# Patient Record
Sex: Female | Born: 1970
Health system: Southern US, Community
[De-identification: ages and names within clinical notes are randomized; demographics above are authoritative.]

---

## 1997-08-01 ENCOUNTER — Other Ambulatory Visit: Admission: RE | Admit: 1997-08-01 | Discharge: 1997-08-01 | Payer: Self-pay | Admitting: *Deleted

## 1998-09-14 ENCOUNTER — Other Ambulatory Visit: Admission: RE | Admit: 1998-09-14 | Discharge: 1998-09-14 | Payer: Self-pay | Admitting: *Deleted

## 2001-07-08 ENCOUNTER — Other Ambulatory Visit: Admission: RE | Admit: 2001-07-08 | Discharge: 2001-07-08 | Payer: Self-pay | Admitting: Obstetrics and Gynecology

## 2002-07-13 ENCOUNTER — Other Ambulatory Visit: Admission: RE | Admit: 2002-07-13 | Discharge: 2002-07-13 | Payer: Self-pay | Admitting: Obstetrics and Gynecology

## 2003-07-23 ENCOUNTER — Inpatient Hospital Stay (HOSPITAL_COMMUNITY): Admission: AD | Admit: 2003-07-23 | Discharge: 2003-07-25 | Payer: Self-pay | Admitting: Obstetrics and Gynecology

## 2003-09-02 ENCOUNTER — Other Ambulatory Visit: Admission: RE | Admit: 2003-09-02 | Discharge: 2003-09-02 | Payer: Self-pay | Admitting: Obstetrics and Gynecology

## 2004-09-14 ENCOUNTER — Other Ambulatory Visit: Admission: RE | Admit: 2004-09-14 | Discharge: 2004-09-14 | Payer: Self-pay | Admitting: Obstetrics and Gynecology

## 2004-11-23 ENCOUNTER — Encounter: Admission: RE | Admit: 2004-11-23 | Discharge: 2004-11-23 | Payer: Self-pay | Admitting: Obstetrics and Gynecology

## 2005-12-16 ENCOUNTER — Inpatient Hospital Stay (HOSPITAL_COMMUNITY): Admission: AD | Admit: 2005-12-16 | Discharge: 2005-12-18 | Payer: Self-pay | Admitting: Obstetrics and Gynecology

## 2008-03-31 ENCOUNTER — Encounter: Admission: RE | Admit: 2008-03-31 | Discharge: 2008-03-31 | Payer: Self-pay | Admitting: General Surgery

## 2008-11-09 ENCOUNTER — Encounter: Admission: RE | Admit: 2008-11-09 | Discharge: 2008-11-09 | Payer: Self-pay | Admitting: Obstetrics and Gynecology

## 2009-04-19 ENCOUNTER — Encounter: Admission: RE | Admit: 2009-04-19 | Discharge: 2009-04-19 | Payer: Self-pay | Admitting: Internal Medicine

## 2009-11-15 ENCOUNTER — Encounter: Admission: RE | Admit: 2009-11-15 | Discharge: 2009-11-15 | Payer: Self-pay | Admitting: Obstetrics and Gynecology

## 2010-06-29 NOTE — Discharge Summary (Signed)
NAME:  Cheryl Hurst, Cheryl Hurst                   ACCOUNT NO.:  [id number]   MEDICAL RECORD NO.:  [id number]                   PATIENT TYPE:  INP   LOCATION:  9102                                 FACILITY:  [facility]   PHYSICIAN:  Zenaida Niece, M.D.             DATE OF BIRTH:  [birth year]   DATE OF ADMISSION:  07/23/2003  DATE OF DISCHARGE:  07/25/2003                                 DISCHARGE SUMMARY   ADMISSION DIAGNOSES:  1. Intrauterine pregnancy at 36 weeks.  2. Premature rupture of membranes.  3. History of herpes simplex virus.   DISCHARGE DIAGNOSES:  1. Intrauterine pregnancy at 36 weeks.  2. Premature rupture of membranes.  3. History of herpes simplex virus.   PROCEDURES:  On July 23, 2003 she had a spontaneous vaginal delivery.   HISTORY AND PHYSICAL:  This is a 40 year-old white female gravida 1, para 0  with an EGA of 36-plus weeks by a 9-week ultrasound with a due date of August 14, 2003 who presents with a complaint of spontaneous rupture of membranes at  2115 hours on July 22, 2003 with contractions starting at 2130 hours without  bleeding and with good fetal movement.  She did not come in to be evaluated  until after midnight on July 23, 2003.  On initial evaluation she was 8 cm  dilated.  Prenatal care complicated by positive Chlamydia treated with  Zithromax with a negative test of cure and she was recently started on  Valtrex for HSV suppression without lesion or symptoms.   PRENATAL LABORATORIES:  Blood type A positive with a negative antibody  screen, RPR nonreactive, rubella immune, hepatitis B surface antigen  negative, HIV negative, gonorrhea negative, Chlamydia was positive, Pap  smear was normal, 1-hour glucola 133, group B strep culture is pending, it  was performed on July 20, 2003.   GYNECOLOGIC HISTORY:  History of herpes simplex virus.   PAST MEDICAL HISTORY:  History of anemia and irritable bowel syndrome.   PAST SURGICAL HISTORY:  Tooth  extraction.   PHYSICAL EXAMINATION:  She is afebrile with stable vital signs.  Fetal heart  tracing reassuring with regular contractions.  Abdomen gravid with a fundal  height of 36 cm on July 20, 2003.  Cervix was 8 cm dilated per the nurse.   HOSPITAL COURSE:  The patient was admitted and continued to progress to  complete fairly rapidly and pushed well.  She had a vaginal delivery of a  viable female infant with Apgars of 8 and 9 that weighed 6 pounds, 6 ounces.  Placenta delivered spontaneously was intact.  She had a second degree  laceration repaired with 2-0 and 3-0 Vicryl with local block.  Estimated  blood loss was less than 500 mL.  The patient progressed too quickly to  receive any antibiotic prophylaxis for group B strep.  Postpartum she had no  complications and on the morning of postpartum day #2 was stable  for  discharge home.   DISCHARGE INSTRUCTIONS:  Regular diet, pelvic, rest, follow up in 6 weeks.  Medications are over-the-counter ibuprofen as needed.  She is given our  discharge pamphlet.                                               Zenaida Niece, M.D.    TDM/MEDQ  D:  07/25/2003  T:  07/25/2003  Job:  [id number]

## 2010-06-29 NOTE — Discharge Summary (Signed)
NAME:  Cheryl Hurst, Cheryl Hurst         ACCOUNT NO.:  [id number]   MEDICAL RECORD NO.:  [id number]          PATIENT TYPE:  INP   LOCATION:  9105                          FACILITY:  [facility]   PHYSICIAN:  Sherron Monday, MD        DATE OF BIRTH:  1970/03/16   DATE OF ADMISSION:  12/16/2005  DATE OF DISCHARGE:  12/18/2005                                 DISCHARGE SUMMARY   ADMITTING DIAGNOSIS:  Intrauterine pregnancy at term, spontaneous rupture of  membranes.   DISCHARGE DIAGNOSIS:  Intrauterine pregnancy at term, spontaneous rupture of  membranes, delivered via spontaneous vaginal delivery.   HISTORY OF PRESENT ILLNESS:  A 40 year old, G2 P1-0-0-1, at 37-4/7 weeks  with SROM at home for clear fluid.  Good fetal movement.  No vaginal  bleeding.  Occasional contractions.  Ultrasound performed at 18 weeks was  consistent with dates and revealed normal anatomy and a posterior placenta.   PAST MEDICAL HISTORY:  Not significant.   PAST SURGICAL HISTORY:  Not significant.   PAST OB/GYN HISTORY:  G1 was a term vaginal delivery with no complications.  G2 is the present pregnancy.  She had an abnormal Pap smear with followup  that has been normal since.  She has history of herpes and she is on  suppression, also a SSE reveals no lesions.   MEDICATIONS:  Prenatal vitamins.   ALLERGIES:  NO KNOWN DRUG ALLERGIES.   SOCIAL HISTORY:  Denies alcohol, tobacco or drugs.  She is married.   FAMILY HISTORY:  Negative for diabetes, hypertension, cancer or coronary  artery disease.   PRENATAL LABORATORIES:  Hemoglobin 11.3, platelets 209.  A positive,  antibody screen negative.  UA was positive for group B strep.  Gonorrhea  negative.  Chlamydia negative.  RPR nonreactive.  Rubella immune.  Cystic  fibrosis negative.  Hepatitis B surface antigen negative.  Glucola 96.   On admission the baby was in the 140s to 150s and reactive with contractions  every 2-4 minutes.  Vaginal exam was 5- to 6-cm dilated, 90%  effaced and 0  station.  Exam of her vulva revealed no herpes outbreak and on questioning  the patient she stated she had no symptoms either.   She was admitted and started on penicillin as prophylaxis with her history  of positive group B strep in her urine.  Her labor was augmented with  Pitocin and spontaneous vaginal delivery was anticipated.  She labored  rapidly and progressed to complete, complete and +2.  Pushed to deliver a  viable female infant at 63 with Apgars of 8 at one minute and 9 at five  minutes and a weight of 7 pounds 10 ounces.  Placenta delivered intact at  1942.  Secondary perineal laceration was repaired in a normal fashion with 3-  0 Vicryl.  EBL was less than 500 mL and delivering was Dr. Ellyn Hack.  Her  postpartum course was uncomplicated.  She remained afebrile with vital signs  stable throughout.  She will be discharged to home on postpartum day two  tolerating a diet with normal lochia and her pain well  controlled.  She  is A positive, rubella immune.  Plans to breastfeed.  Will  start oral contraceptives at her postpartum checkup.  She was given routine  discharge instructions and numbers to call with any questions or problems.  She voiced understanding to this and will be discharged to home.      Sherron Monday, MD  Electronically Signed     JB/MEDQ  D:  12/18/2005  T:  12/18/2005  Job:  [id number]

## 2011-02-26 ENCOUNTER — Other Ambulatory Visit: Payer: Self-pay | Admitting: Obstetrics and Gynecology

## 2011-02-26 DIAGNOSIS — Z1231 Encounter for screening mammogram for malignant neoplasm of breast: Secondary | ICD-10-CM

## 2011-03-04 IMAGING — US US SOFT TISSUE HEAD/NECK
1 series · 14 of 25 positions shown · non-contrast
Comparison: 03/31/2008

CLINICAL DATA: Multinodular goiter.

THYROID ULTRASOUND
TECHNIQUE: Ultrasound examination of the thyroid gland and
adjacent soft tissues was performed.

[Series 1: us soft tissue head/neck · 0.08mm/px · 14 of 54 slices shown]
[im 1/54]
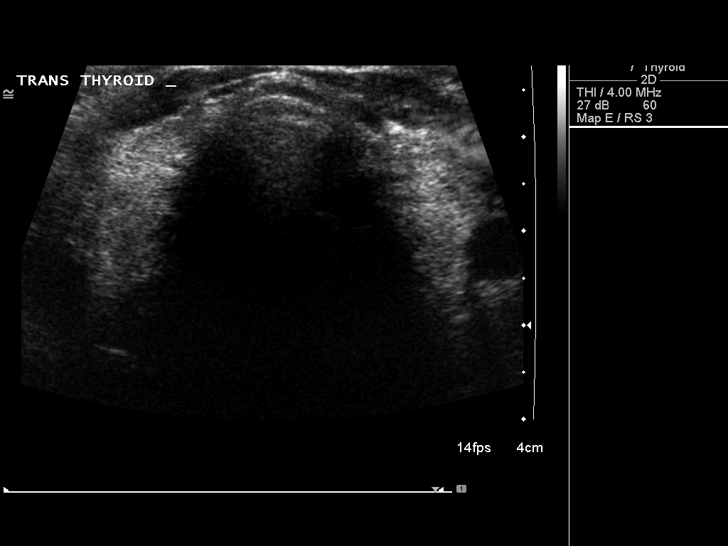
[im 5/54]
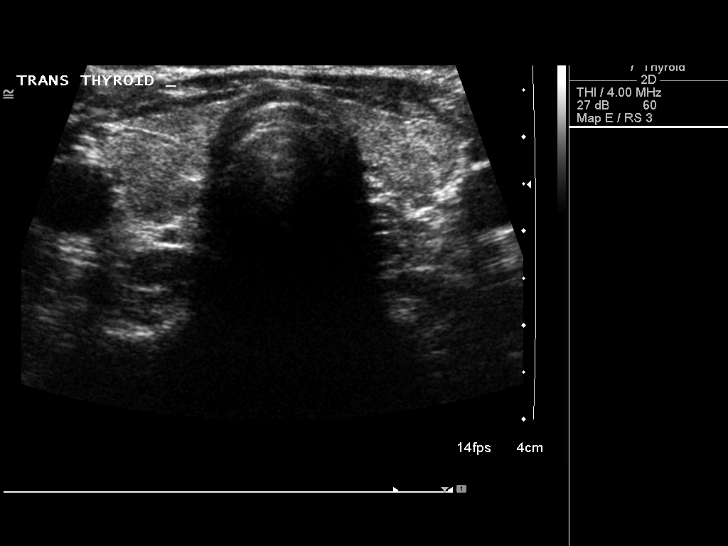
[im 9/54]
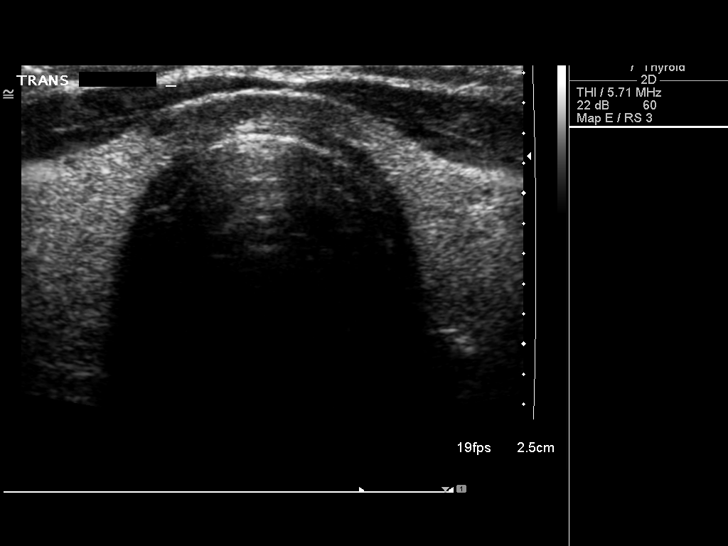
[im 14/54]
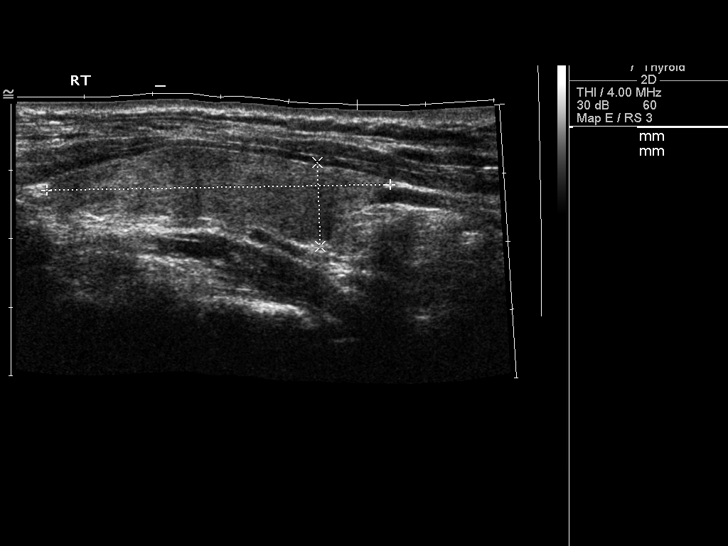
[im 18/54]
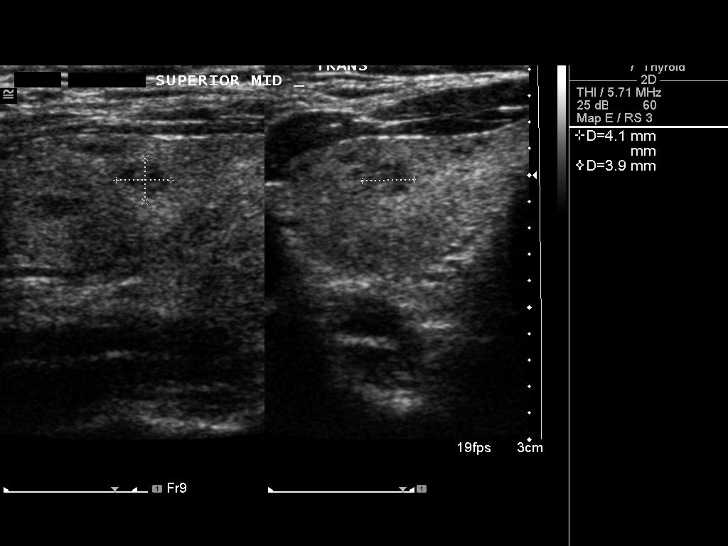
[im 20/54]
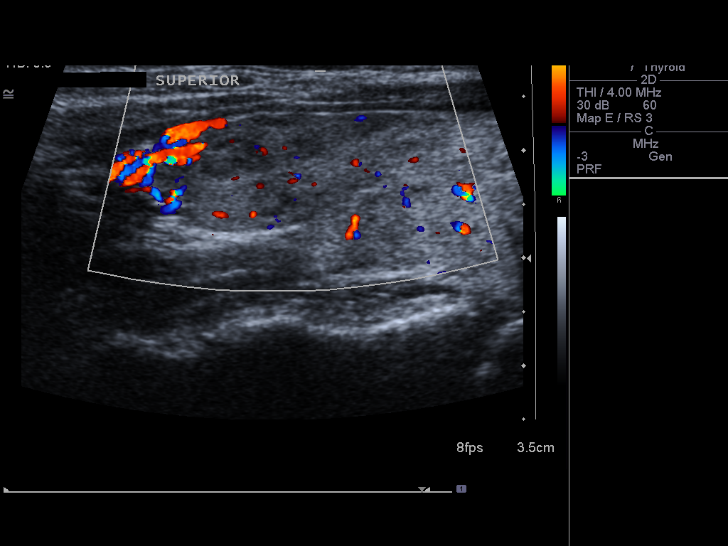
[im 25/54]
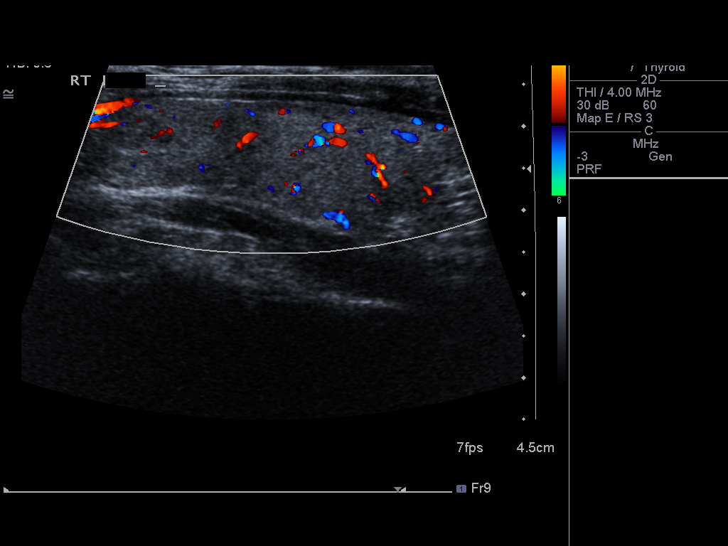
[im 29/54]
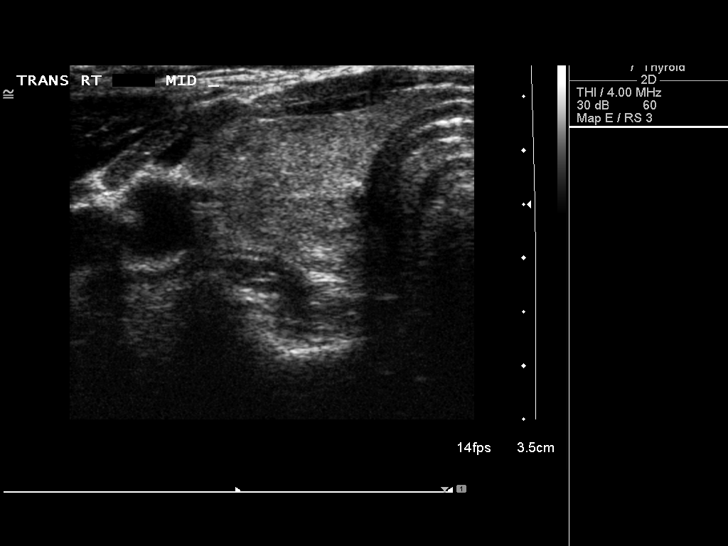
[im 34/54]
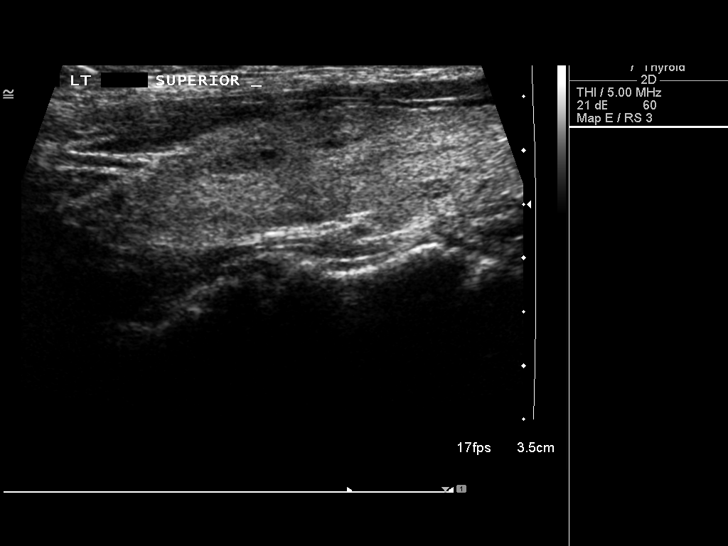
[im 36/54]
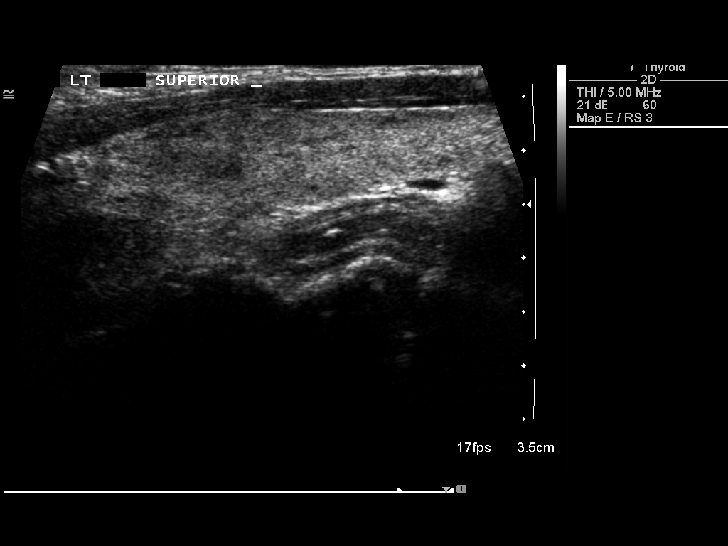
[im 40/54]
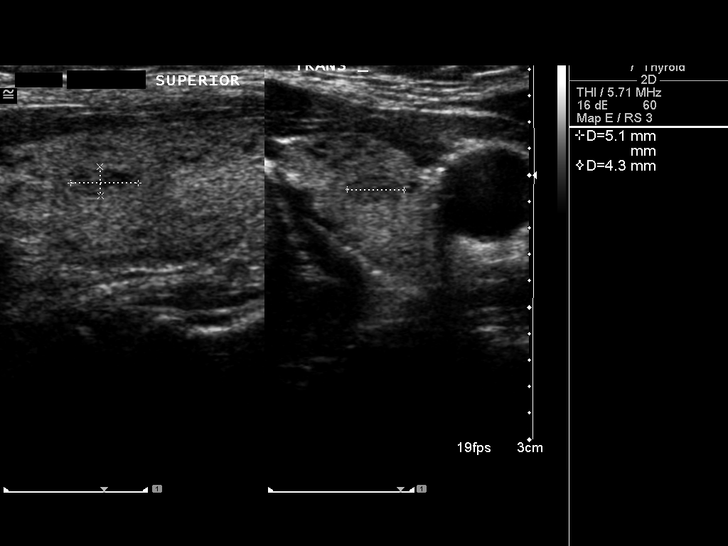
[im 45/54]
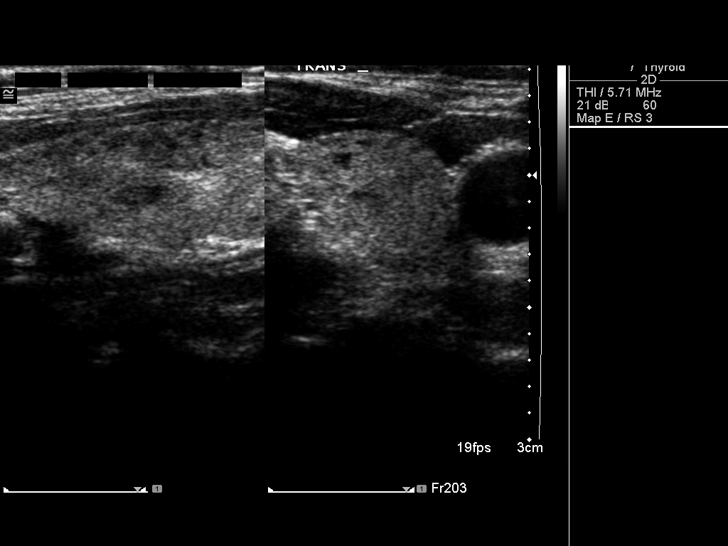
[im 49/54]
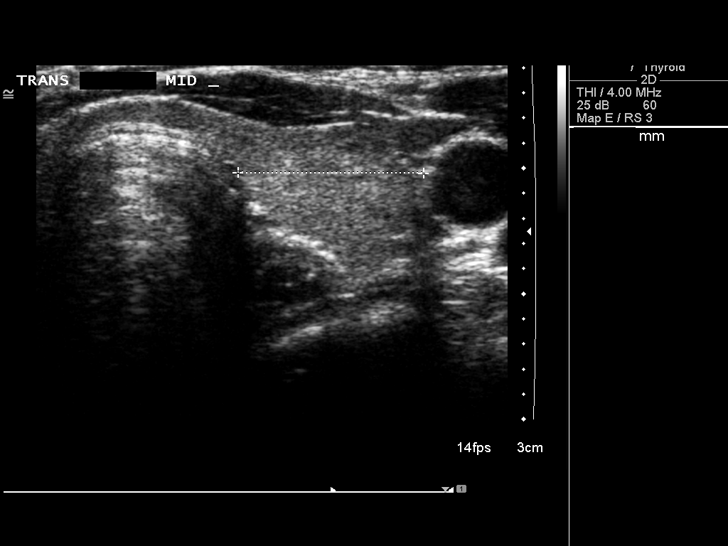
[im 54/54]
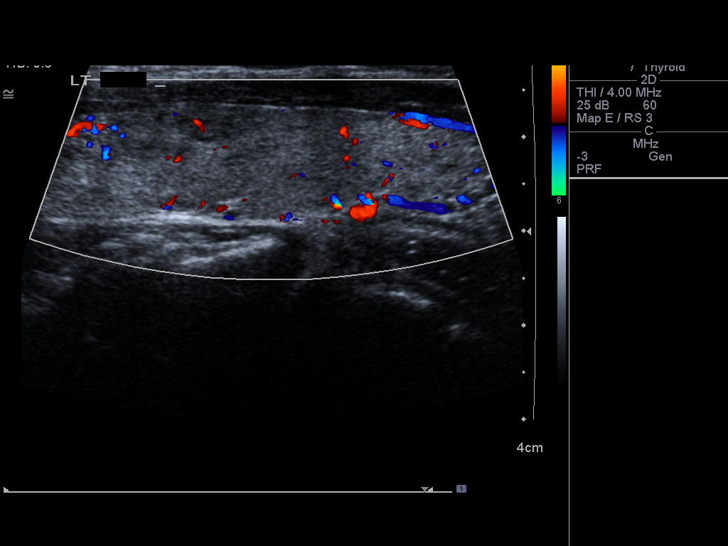

[14 of 25 positions shown; findings below may reference images not displayed]

FINDINGS: Right thyroid lobe measures 5.0 x 1.2 x 1.8 cm.  Left
thyroid lobe measures 5.5 x 1.2 x 1.5 cm.  No significant change in
size is seen compared to prior exam.

Diffuse inhomogeneous appearance of the thyroid parenchyma is seen.
Several tiny less than 1 cm hypoechoic nodules are seen and upper
poles of both thyroid lobes, without significant change compared to
previous study.  No dominant nodule or other suspicious morphologic
features identified.
IMPRESSION: Stable multinodular thyroid.  No dominant nodule identified.

## 2011-03-13 ENCOUNTER — Ambulatory Visit
Admission: RE | Admit: 2011-03-13 | Discharge: 2011-03-13 | Disposition: A | Discharge status: Home / Self Care | Payer: Managed Care, Other (non HMO) | Source: Ambulatory Visit | Attending: Obstetrics and Gynecology | Admitting: Obstetrics and Gynecology

## 2011-03-13 DIAGNOSIS — Z1231 Encounter for screening mammogram for malignant neoplasm of breast: Secondary | ICD-10-CM

## 2012-03-13 ENCOUNTER — Other Ambulatory Visit: Payer: Self-pay | Admitting: Obstetrics and Gynecology

## 2012-03-13 DIAGNOSIS — Z1231 Encounter for screening mammogram for malignant neoplasm of breast: Secondary | ICD-10-CM

## 2012-03-17 ENCOUNTER — Ambulatory Visit
Admission: RE | Admit: 2012-03-17 | Discharge: 2012-03-17 | Disposition: A | Discharge status: Home / Self Care | Payer: Managed Care, Other (non HMO) | Source: Ambulatory Visit | Attending: Obstetrics and Gynecology | Admitting: Obstetrics and Gynecology

## 2012-03-17 DIAGNOSIS — Z1231 Encounter for screening mammogram for malignant neoplasm of breast: Secondary | ICD-10-CM

## 2013-03-02 ENCOUNTER — Other Ambulatory Visit: Payer: Self-pay

## 2013-03-02 DIAGNOSIS — Z1231 Encounter for screening mammogram for malignant neoplasm of breast: Secondary | ICD-10-CM

## 2013-03-25 ENCOUNTER — Ambulatory Visit
Admission: RE | Admit: 2013-03-25 | Discharge: 2013-03-25 | Disposition: A | Discharge status: Home / Self Care | Payer: Managed Care, Other (non HMO) | Source: Ambulatory Visit

## 2013-03-25 DIAGNOSIS — Z1231 Encounter for screening mammogram for malignant neoplasm of breast: Secondary | ICD-10-CM

## 2014-10-10 ENCOUNTER — Ambulatory Visit
Admission: RE | Admit: 2014-10-10 | Discharge: 2014-10-10 | Disposition: A | Discharge status: Home / Self Care | Payer: Managed Care, Other (non HMO) | Source: Ambulatory Visit | Attending: Internal Medicine | Admitting: Internal Medicine

## 2014-10-10 ENCOUNTER — Other Ambulatory Visit: Payer: Self-pay | Admitting: Internal Medicine

## 2014-10-10 DIAGNOSIS — E049 Nontoxic goiter, unspecified: Secondary | ICD-10-CM

## 2015-06-19 ENCOUNTER — Other Ambulatory Visit: Payer: Self-pay | Admitting: Obstetrics and Gynecology

## 2015-06-19 DIAGNOSIS — R928 Other abnormal and inconclusive findings on diagnostic imaging of breast: Secondary | ICD-10-CM

## 2015-06-27 ENCOUNTER — Ambulatory Visit
Admission: RE | Admit: 2015-06-27 | Discharge: 2015-06-27 | Disposition: A | Discharge status: Home / Self Care | Payer: Managed Care, Other (non HMO) | Source: Ambulatory Visit | Attending: Obstetrics and Gynecology | Admitting: Obstetrics and Gynecology

## 2015-06-27 DIAGNOSIS — R928 Other abnormal and inconclusive findings on diagnostic imaging of breast: Secondary | ICD-10-CM

## 2016-08-24 IMAGING — US US SOFT TISSUE HEAD/NECK
1 series · 14 of 25 positions shown · non-contrast
Comparison: None.

CLINICAL DATA: Goiter

EXAM:
THYROID ULTRASOUND
TECHNIQUE: Ultrasound examination of the thyroid gland and adjacent soft
tissues was performed.

[Series 1: us soft tissue head/neck · 0.05mm/px · 14 of 38 slices shown]
[im 1/38]
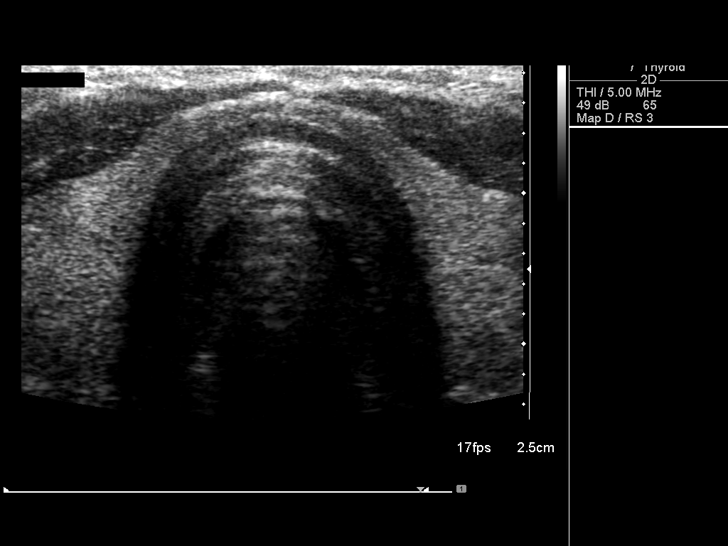
[im 4/38]
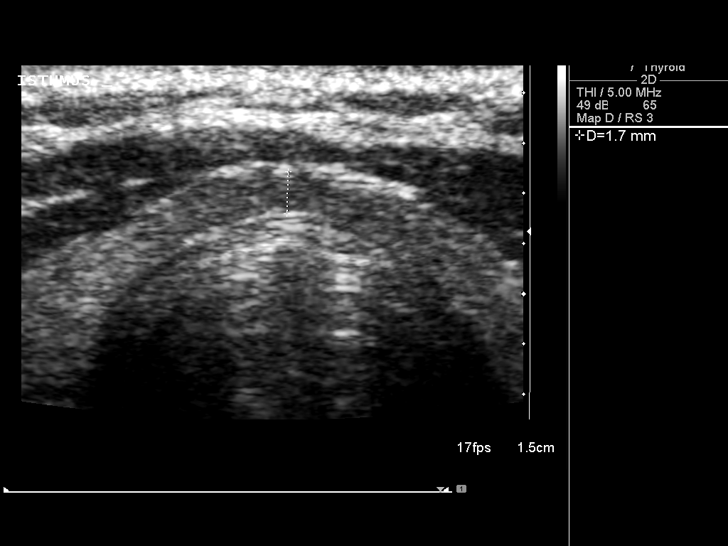
[im 7/38]
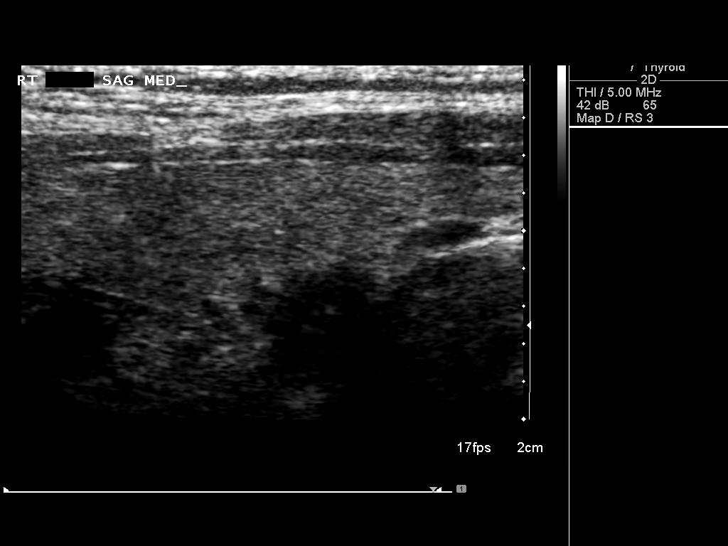
[im 10/38]
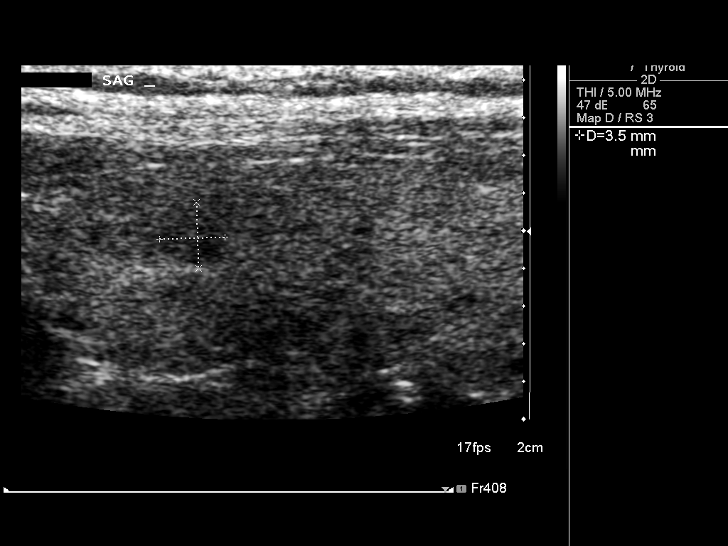
[im 13/38]
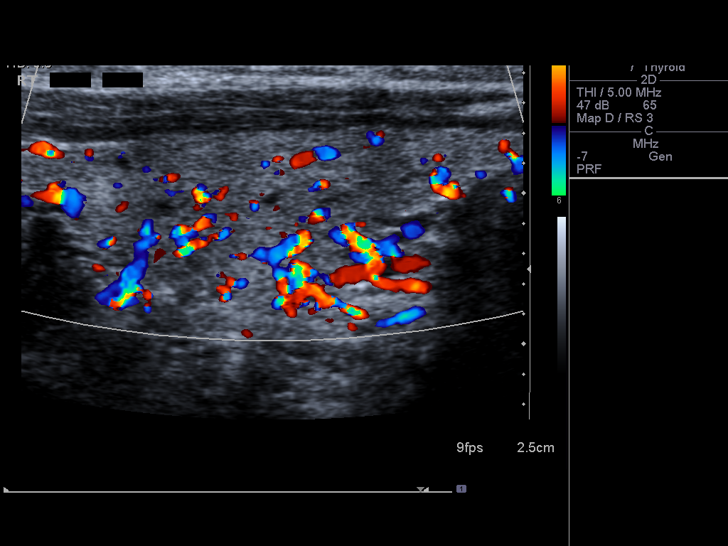
[im 14/38]
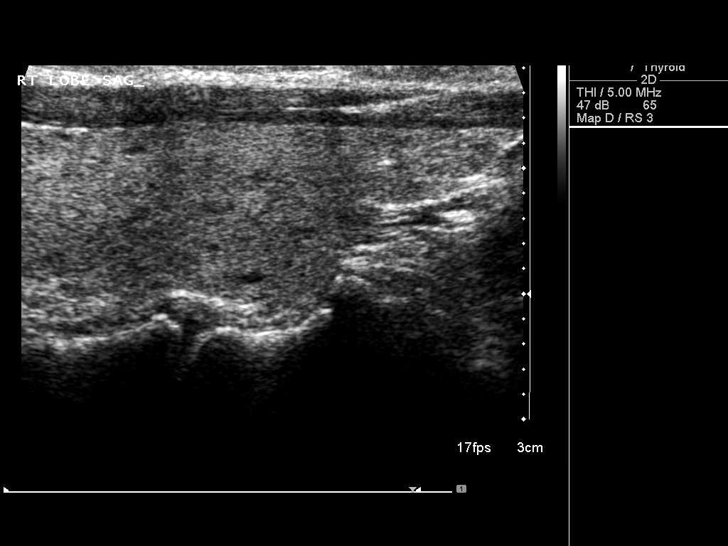
[im 17/38]
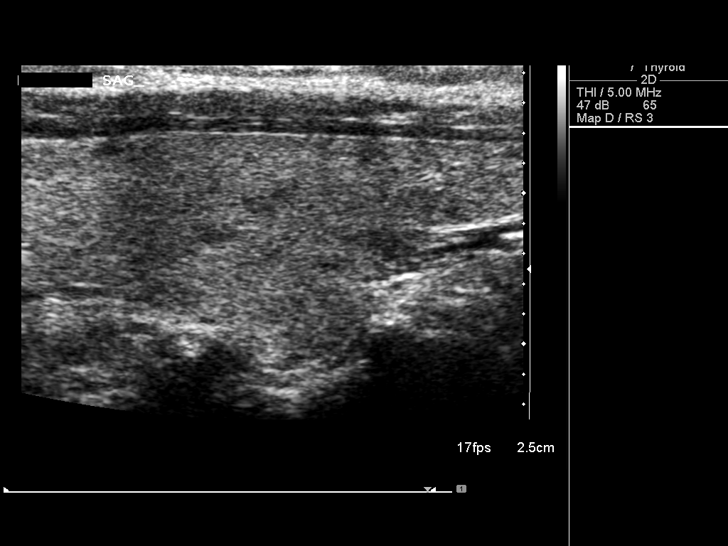
[im 21/38]
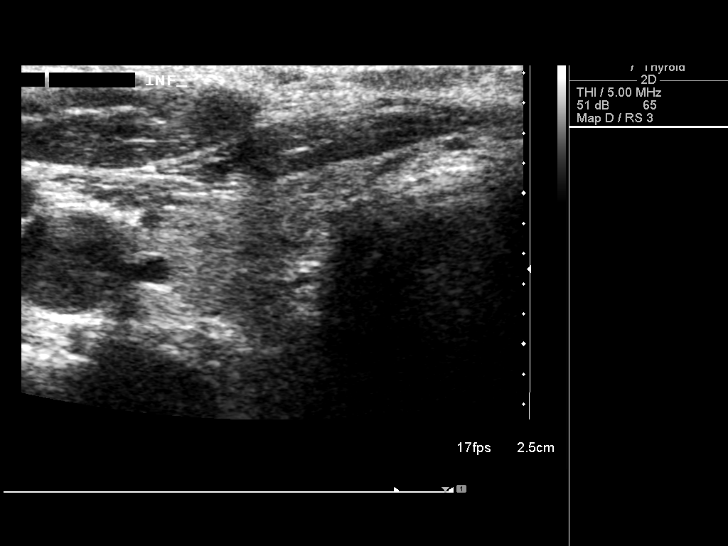
[im 24/38]
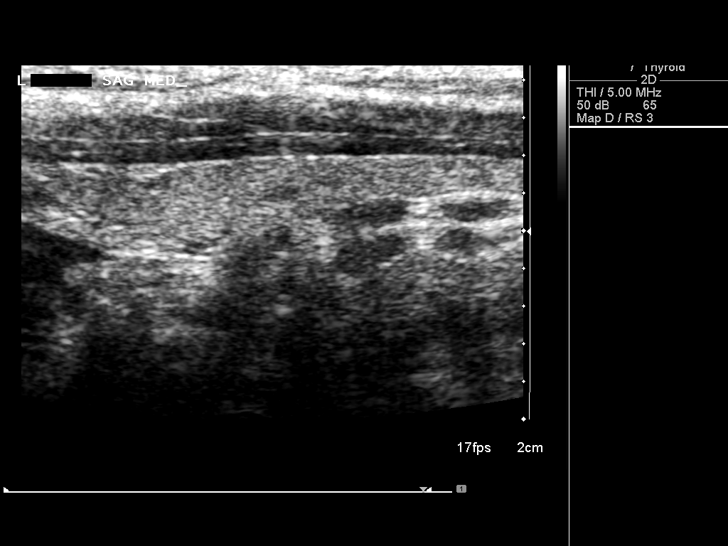
[im 25/38]
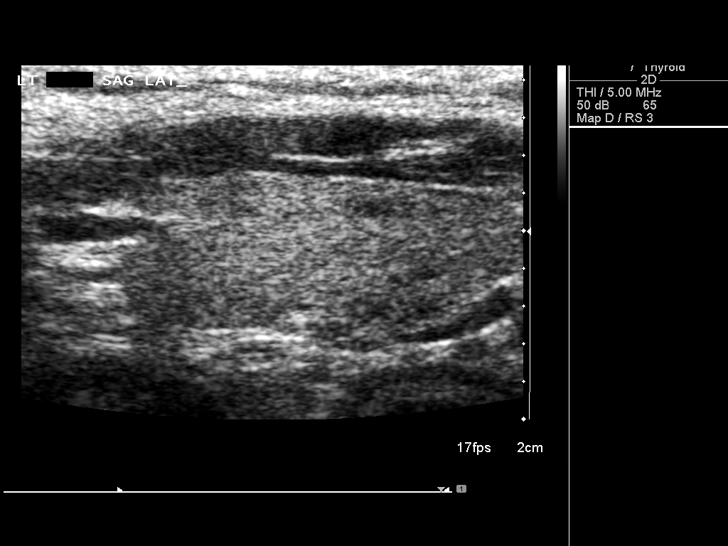
[im 28/38]
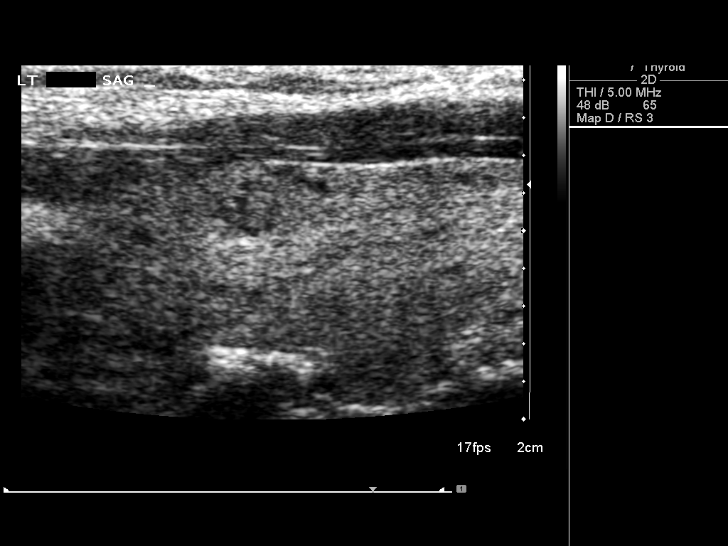
[im 31/38]
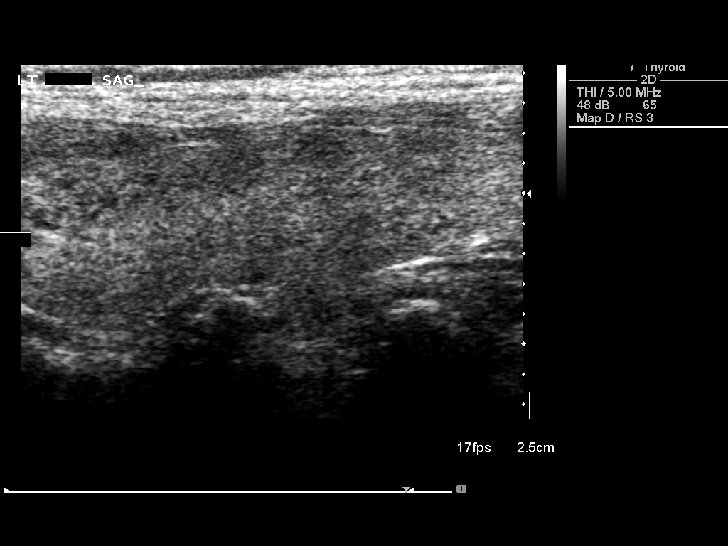
[im 34/38]
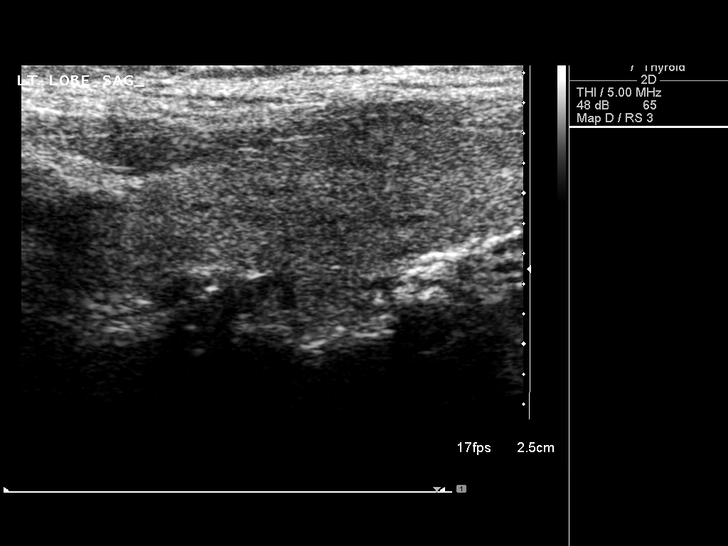
[im 38/38]
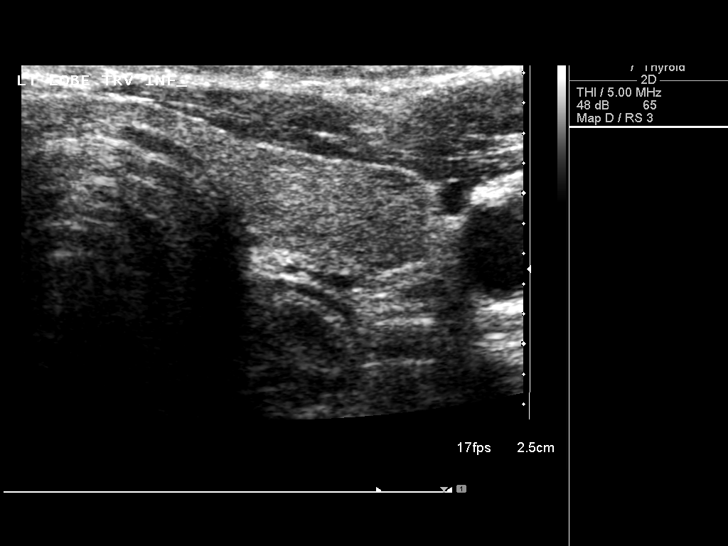

[14 of 25 positions shown; findings below may reference images not displayed]

FINDINGS: Right thyroid lobe

Measurements: 4.6 x 1.4 x 1.6 cm. 4 mm hypoechoic upper pole nodule.

Left thyroid lobe

Measurements: 4.5 x 1.2 x 1.3 cm. 5 mm hypoechoic upper pole nodule.
3 mm hypoechoic upper pole nodule.

Isthmus

Thickness: 2 mm.  No nodules visualized.

Lymphadenopathy

None visualized.
IMPRESSION: Small bilateral nodules as described. Findings do not meet current
SRU consensus criteria for biopsy. Follow-up by clinical exam is
recommended. If patient has known risk factors for thyroid
carcinoma, consider follow-up ultrasound in 12 months. If patient is
clinically hyperthyroid, consider nuclear medicine thyroid uptake
and scan.Reference: Management of Thyroid Nodules Detected at US:
Society of Radiologists in Ultrasound Consensus Conference

## 2020-07-31 DIAGNOSIS — F330 Major depressive disorder, recurrent, mild: Secondary | ICD-10-CM | POA: Diagnosis not present

## 2020-08-15 DIAGNOSIS — F330 Major depressive disorder, recurrent, mild: Secondary | ICD-10-CM | POA: Diagnosis not present

## 2020-08-29 DIAGNOSIS — F330 Major depressive disorder, recurrent, mild: Secondary | ICD-10-CM | POA: Diagnosis not present

## 2020-09-11 DIAGNOSIS — F330 Major depressive disorder, recurrent, mild: Secondary | ICD-10-CM | POA: Diagnosis not present

## 2020-10-03 DIAGNOSIS — F330 Major depressive disorder, recurrent, mild: Secondary | ICD-10-CM | POA: Diagnosis not present

## 2020-10-17 DIAGNOSIS — F330 Major depressive disorder, recurrent, mild: Secondary | ICD-10-CM | POA: Diagnosis not present

## 2020-10-24 DIAGNOSIS — E042 Nontoxic multinodular goiter: Secondary | ICD-10-CM | POA: Diagnosis not present

## 2020-10-24 DIAGNOSIS — Z8349 Family history of other endocrine, nutritional and metabolic diseases: Secondary | ICD-10-CM | POA: Diagnosis not present

## 2020-10-24 DIAGNOSIS — E063 Autoimmune thyroiditis: Secondary | ICD-10-CM | POA: Diagnosis not present

## 2020-11-13 ENCOUNTER — Ambulatory Visit (INDEPENDENT_AMBULATORY_CARE_PROVIDER_SITE_OTHER): Payer: BC Managed Care – PPO

## 2020-11-13 ENCOUNTER — Ambulatory Visit (HOSPITAL_COMMUNITY)
Admission: EM | Admit: 2020-11-13 | Discharge: 2020-11-13 | Disposition: A | Discharge status: Home / Self Care | Payer: BC Managed Care – PPO | Attending: Internal Medicine | Admitting: Internal Medicine

## 2020-11-13 ENCOUNTER — Other Ambulatory Visit: Payer: Self-pay

## 2020-11-13 ENCOUNTER — Ambulatory Visit (HOSPITAL_COMMUNITY): Payer: BC Managed Care – PPO

## 2020-11-13 ENCOUNTER — Encounter (HOSPITAL_COMMUNITY): Payer: Self-pay

## 2020-11-13 DIAGNOSIS — S62614A Displaced fracture of proximal phalanx of right ring finger, initial encounter for closed fracture: Secondary | ICD-10-CM

## 2020-11-13 DIAGNOSIS — S62619A Displaced fracture of proximal phalanx of unspecified finger, initial encounter for closed fracture: Secondary | ICD-10-CM

## 2020-11-13 DIAGNOSIS — S62644A Nondisplaced fracture of proximal phalanx of right ring finger, initial encounter for closed fracture: Secondary | ICD-10-CM

## 2020-11-13 DIAGNOSIS — S62649A Nondisplaced fracture of proximal phalanx of unspecified finger, initial encounter for closed fracture: Secondary | ICD-10-CM

## 2020-11-13 DIAGNOSIS — M7989 Other specified soft tissue disorders: Secondary | ICD-10-CM | POA: Diagnosis not present

## 2020-11-13 MED ORDER — MELOXICAM 7.5 MG PO TABS: 7.5000 mg | ORAL_TABLET | Freq: Every day | ORAL | 0 refills | Status: AC

## 2020-11-13 NOTE — Discharge Instructions (Signed)
Your x-ray today showed a fracture ( break in bone) of middle finger    A splint has been applied to your middle finger. This is used to protect your injury and prevent further damage. Leave in place until seen by orthopedic specialist. Do not put objects into splint to scratch skin. If numbness or tingling occurs after placement please return to Urgent Care for evaluation.   Follow up with orthopedic specialist in 1-2 weeks. Call practice to make appointment. Information listed below. You may go to any orthopedic provider you deem fit.  Please do not put weight on fracture.

## 2020-11-13 NOTE — ED Triage Notes (Signed)
Pt presents with an injury to the L fourth finger. Pt states she was her singer snapped while playing with her dog.

## 2020-11-14 NOTE — ED Provider Notes (Signed)
[facility]    CSN: [id number] Arrival date & time: 11/13/20  1942      History   Chief Complaint Chief Complaint  Patient presents with   Finger Injury    HPI Cheryl Hurst is a 50 y.o. female.   Patient presents with left fourth finger pain and swelling beginning today after playing tug with her dog.  Dog came but away and she heard her finger snap.  Symptoms began approximately 45 minutes after.  Endorses some numbness and tingling along the fourth finger.  Limited range of motion.  Denies prior injury or trauma.  Has not attempted treatment.  History reviewed. No pertinent past medical history.  There are no problems to display for this patient.   History reviewed. No pertinent surgical history.  OB History   No obstetric history on file.      Home Medications    Prior to Admission medications   Medication Sig Start Date End Date Taking? Authorizing Provider  meloxicam (MOBIC) 7.5 MG tablet Take 1 tablet (7.5 mg total) by mouth daily. 11/13/20  Yes Valinda Hoar, NP    Family History History reviewed. No pertinent family history.  Social History Social History   Tobacco Use   Smoking status: Never   Smokeless tobacco: Never  Vaping Use   Vaping Use: Never used  Substance Use Topics   Alcohol use: Yes   Drug use: Never     Allergies   Patient has no known allergies.   Review of Systems Review of Systems  Constitutional: Negative.   Respiratory: Negative.    Cardiovascular: Negative.   Musculoskeletal:  Positive for joint swelling. Negative for arthralgias, back pain, gait problem, myalgias, neck pain and neck stiffness.  Skin: Negative.     Physical Exam Triage Vital Signs ED Triage Vitals  Enc Vitals Group     BP 11/13/20 2004 (!) 129/98     Pulse Rate 11/13/20 2004 68     Resp 11/13/20 2004 19     Temp 11/13/20 2004 98 F (36.7 C)     Temp Source 11/13/20 2004 Oral     SpO2 11/13/20 2004 100 %     Weight --       Height --      Head Circumference --      Peak Flow --      Pain Score 11/13/20 2002 6     Pain Loc --      Pain Edu? --      Excl. in GC? --    No data found.  Updated Vital Signs BP (!) 129/98 (BP Location: Right Arm)   Pulse 68   Temp 98 F (36.7 C) (Oral)   Resp 19   LMP 10/14/2020 (Approximate)   SpO2 100%   Visual Acuity Right Eye Distance:   Left Eye Distance:   Bilateral Distance:    Right Eye Near:   Left Eye Near:    Bilateral Near:     Physical Exam Constitutional:      Appearance: Normal appearance. She is normal weight.  HENT:     Head: Normocephalic.  Eyes:     Extraocular Movements: Extraocular movements intact.  Pulmonary:     Effort: Pulmonary effort is normal.  Musculoskeletal:     Comments: Mild to moderate swelling and tenderness along the proximal phalanx of the right  fourth finger, no erythema noted, limited range of motion unable to flex finger or fully extend, decreased sensation, capillary  refill less than 3, 2+ radial pulse  Skin:    General: Skin is warm and dry.  Neurological:     Mental Status: She is alert and oriented to person, place, and time. Mental status is at baseline.  Psychiatric:        Mood and Affect: Mood normal.        Behavior: Behavior normal.     UC Treatments / Results  Labs (all labs ordered are listed, but only abnormal results are displayed) Labs Reviewed - No data to display  EKG   Radiology DG Finger Ring Right  Result Date: 11/13/2020 CLINICAL DATA:  Pulling injury to the fourth digit with pain and swelling, initial encounter EXAM: RIGHT RING FINGER 2+V COMPARISON:  None. FINDINGS: Oblique fracture through the fourth proximal phalanx is noted extending from the midportion distally. Only minimal displacement is noted. Mild soft tissue swelling is noted. IMPRESSION: Fourth proximal phalangeal fracture. Electronically Signed   By: Alcide Clever M.D.   On: 11/13/2020 20:24    Procedures Procedures  (including critical care time)  Medications Ordered in UC Medications - No data to display  Initial Impression / Assessment and Plan / UC Course  I have reviewed the triage vital signs and the nursing notes.  Pertinent labs & imaging results that were available during my care of the patient were reviewed by me and considered in my medical decision making (see chart for details).  Closed nondisplaced fracture of the proximal phalanx of finger of the right hand  X-ray to confirm fracture  1.  Splint placed on right fourth finger by nursing staff, neurovascularly intact prior to and after placement, splint to be left in place until seen by orthopedic specialist, nonweightbearing 2.  Orthopedic follow-up in 1 to 2 weeks, given walking referral to [facility], discussed with patient that she may use what ever orthopedic specialist she deems appropriate 3.  Meloxicam 7.5 mg daily prn Final Clinical Impressions(s) / UC Diagnoses   Final diagnoses:  Closed displaced fracture of proximal phalanx of finger of right hand     Discharge Instructions      Your x-ray today showed a fracture ( break in bone) of middle finger    A splint has been applied to your middle finger. This is used to protect your injury and prevent further damage. Leave in place until seen by orthopedic specialist. Do not put objects into splint to scratch skin. If numbness or tingling occurs after placement please return to Urgent Care for evaluation.   Follow up with orthopedic specialist in 1-2 weeks. Call practice to make appointment. Information listed below. You may go to any orthopedic provider you deem fit.  Please do not put weight on fracture.      ED Prescriptions     Medication Sig Dispense Auth. Provider   meloxicam (MOBIC) 7.5 MG tablet Take 1 tablet (7.5 mg total) by mouth daily. 30 tablet Valinda Hoar, NP      PDMP not reviewed this encounter.   Valinda Hoar, NP 11/14/20 1031

## 2020-11-21 DIAGNOSIS — M79644 Pain in right finger(s): Secondary | ICD-10-CM | POA: Diagnosis not present

## 2020-11-21 DIAGNOSIS — S62614A Displaced fracture of proximal phalanx of right ring finger, initial encounter for closed fracture: Secondary | ICD-10-CM | POA: Diagnosis not present

## 2020-12-05 DIAGNOSIS — S62614A Displaced fracture of proximal phalanx of right ring finger, initial encounter for closed fracture: Secondary | ICD-10-CM | POA: Diagnosis not present

## 2020-12-12 DIAGNOSIS — M25641 Stiffness of right hand, not elsewhere classified: Secondary | ICD-10-CM | POA: Diagnosis not present

## 2020-12-12 DIAGNOSIS — M79641 Pain in right hand: Secondary | ICD-10-CM | POA: Diagnosis not present

## 2020-12-26 DIAGNOSIS — M25641 Stiffness of right hand, not elsewhere classified: Secondary | ICD-10-CM | POA: Diagnosis not present

## 2021-01-08 DIAGNOSIS — M25641 Stiffness of right hand, not elsewhere classified: Secondary | ICD-10-CM | POA: Diagnosis not present

## 2021-01-22 DIAGNOSIS — M25641 Stiffness of right hand, not elsewhere classified: Secondary | ICD-10-CM | POA: Diagnosis not present

## 2021-03-06 DIAGNOSIS — Z1231 Encounter for screening mammogram for malignant neoplasm of breast: Secondary | ICD-10-CM | POA: Diagnosis not present

## 2021-03-06 DIAGNOSIS — N841 Polyp of cervix uteri: Secondary | ICD-10-CM | POA: Diagnosis not present

## 2021-03-06 DIAGNOSIS — Z130 Encounter for screening for diseases of the blood and blood-forming organs and certain disorders involving the immune mechanism: Secondary | ICD-10-CM | POA: Diagnosis not present

## 2021-03-06 DIAGNOSIS — Z01419 Encounter for gynecological examination (general) (routine) without abnormal findings: Secondary | ICD-10-CM | POA: Diagnosis not present

## 2021-03-06 DIAGNOSIS — Z202 Contact with and (suspected) exposure to infections with a predominantly sexual mode of transmission: Secondary | ICD-10-CM | POA: Diagnosis not present

## 2021-03-06 DIAGNOSIS — Z124 Encounter for screening for malignant neoplasm of cervix: Secondary | ICD-10-CM | POA: Diagnosis not present

## 2021-03-06 DIAGNOSIS — Z113 Encounter for screening for infections with a predominantly sexual mode of transmission: Secondary | ICD-10-CM | POA: Diagnosis not present

## 2021-03-06 DIAGNOSIS — Z1151 Encounter for screening for human papillomavirus (HPV): Secondary | ICD-10-CM | POA: Diagnosis not present

## 2021-03-23 DIAGNOSIS — Z79899 Other long term (current) drug therapy: Secondary | ICD-10-CM | POA: Diagnosis not present

## 2021-03-23 DIAGNOSIS — L700 Acne vulgaris: Secondary | ICD-10-CM | POA: Diagnosis not present

## 2021-03-30 DIAGNOSIS — Z23 Encounter for immunization: Secondary | ICD-10-CM | POA: Diagnosis not present

## 2021-03-30 DIAGNOSIS — E7800 Pure hypercholesterolemia, unspecified: Secondary | ICD-10-CM | POA: Diagnosis not present

## 2021-03-30 DIAGNOSIS — Z79899 Other long term (current) drug therapy: Secondary | ICD-10-CM | POA: Diagnosis not present

## 2021-03-30 DIAGNOSIS — Z0000 Encounter for general adult medical examination without abnormal findings: Secondary | ICD-10-CM | POA: Diagnosis not present

## 2021-04-17 DIAGNOSIS — L919 Hypertrophic disorder of the skin, unspecified: Secondary | ICD-10-CM | POA: Diagnosis not present

## 2021-04-17 DIAGNOSIS — L989 Disorder of the skin and subcutaneous tissue, unspecified: Secondary | ICD-10-CM | POA: Diagnosis not present

## 2021-04-20 DIAGNOSIS — Z79899 Other long term (current) drug therapy: Secondary | ICD-10-CM | POA: Diagnosis not present

## 2021-06-21 DIAGNOSIS — L700 Acne vulgaris: Secondary | ICD-10-CM | POA: Diagnosis not present

## 2021-07-05 DIAGNOSIS — K625 Hemorrhage of anus and rectum: Secondary | ICD-10-CM | POA: Diagnosis not present

## 2021-07-05 DIAGNOSIS — K5900 Constipation, unspecified: Secondary | ICD-10-CM | POA: Diagnosis not present

## 2021-07-05 DIAGNOSIS — Z1211 Encounter for screening for malignant neoplasm of colon: Secondary | ICD-10-CM | POA: Diagnosis not present

## 2021-07-18 DIAGNOSIS — K5730 Diverticulosis of large intestine without perforation or abscess without bleeding: Secondary | ICD-10-CM | POA: Diagnosis not present

## 2021-07-18 DIAGNOSIS — Z1211 Encounter for screening for malignant neoplasm of colon: Secondary | ICD-10-CM | POA: Diagnosis not present

## 2021-07-18 DIAGNOSIS — D127 Benign neoplasm of rectosigmoid junction: Secondary | ICD-10-CM | POA: Diagnosis not present

## 2022-01-28 DIAGNOSIS — R059 Cough, unspecified: Secondary | ICD-10-CM | POA: Diagnosis not present

## 2022-01-28 DIAGNOSIS — Z20822 Contact with and (suspected) exposure to covid-19: Secondary | ICD-10-CM | POA: Diagnosis not present

## 2022-01-28 DIAGNOSIS — R5381 Other malaise: Secondary | ICD-10-CM | POA: Diagnosis not present

## 2022-04-09 DIAGNOSIS — E7800 Pure hypercholesterolemia, unspecified: Secondary | ICD-10-CM | POA: Diagnosis not present

## 2022-04-09 DIAGNOSIS — Z79899 Other long term (current) drug therapy: Secondary | ICD-10-CM | POA: Diagnosis not present

## 2022-04-09 DIAGNOSIS — Z0000 Encounter for general adult medical examination without abnormal findings: Secondary | ICD-10-CM | POA: Diagnosis not present

## 2022-05-10 DIAGNOSIS — L219 Seborrheic dermatitis, unspecified: Secondary | ICD-10-CM | POA: Diagnosis not present

## 2022-05-14 DIAGNOSIS — Z01419 Encounter for gynecological examination (general) (routine) without abnormal findings: Secondary | ICD-10-CM | POA: Diagnosis not present

## 2022-05-14 DIAGNOSIS — Z1389 Encounter for screening for other disorder: Secondary | ICD-10-CM | POA: Diagnosis not present

## 2022-05-14 DIAGNOSIS — Z1231 Encounter for screening mammogram for malignant neoplasm of breast: Secondary | ICD-10-CM | POA: Diagnosis not present

## 2022-06-24 DIAGNOSIS — Z79899 Other long term (current) drug therapy: Secondary | ICD-10-CM | POA: Diagnosis not present

## 2022-06-24 DIAGNOSIS — L700 Acne vulgaris: Secondary | ICD-10-CM | POA: Diagnosis not present

## 2022-07-26 DIAGNOSIS — N912 Amenorrhea, unspecified: Secondary | ICD-10-CM | POA: Diagnosis not present

## 2022-08-22 DIAGNOSIS — L700 Acne vulgaris: Secondary | ICD-10-CM | POA: Diagnosis not present

## 2022-08-22 DIAGNOSIS — Z79899 Other long term (current) drug therapy: Secondary | ICD-10-CM | POA: Diagnosis not present

## 2022-09-28 IMAGING — DX DG FINGER RING 2+V*R*
3 series · 3 of 3 positions shown · non-contrast
Comparison: None.

CLINICAL DATA: Pulling injury to the fourth digit with pain and
swelling, initial encounter

EXAM:
RIGHT RING FINGER 2+V

[finger ap]
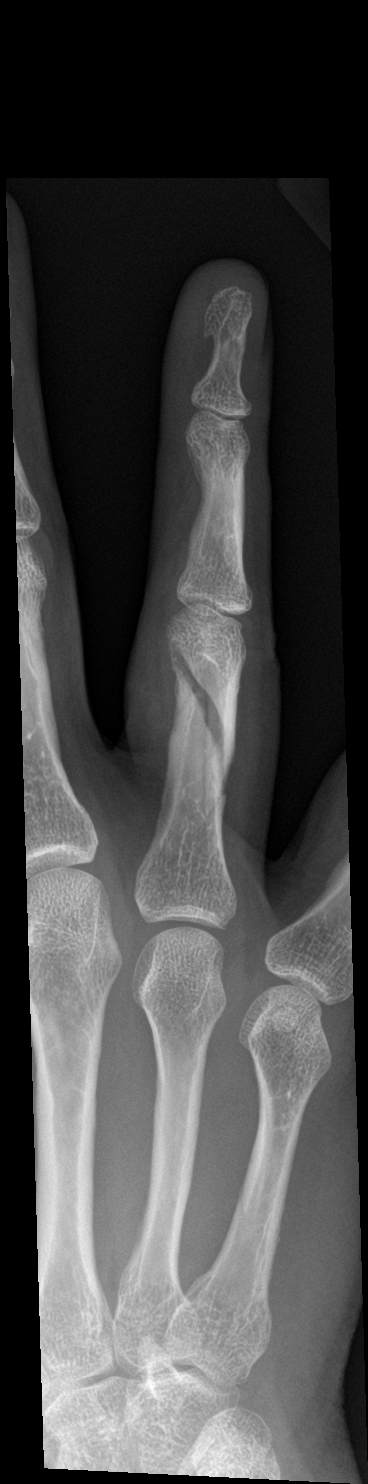

[finger obl]
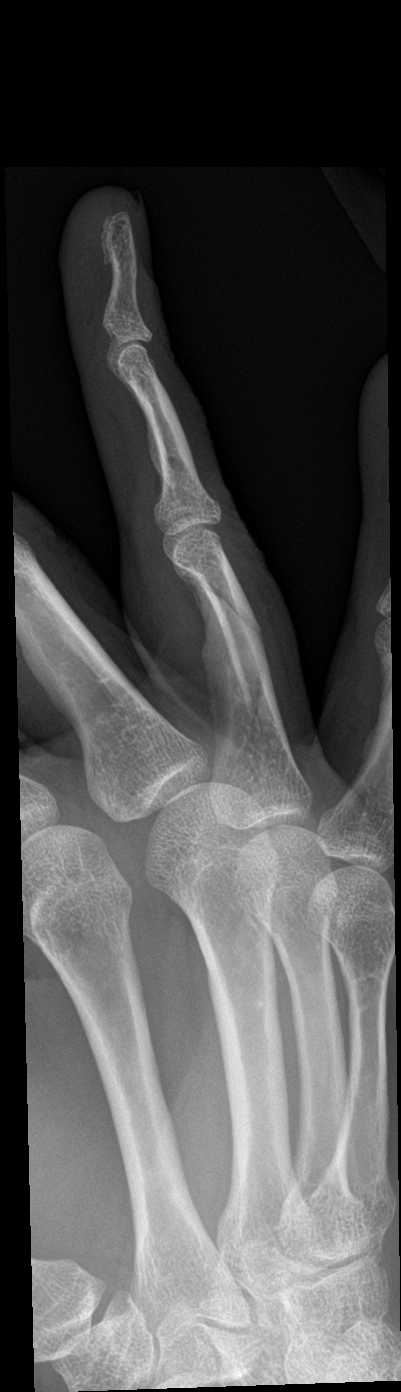

[finger lat]
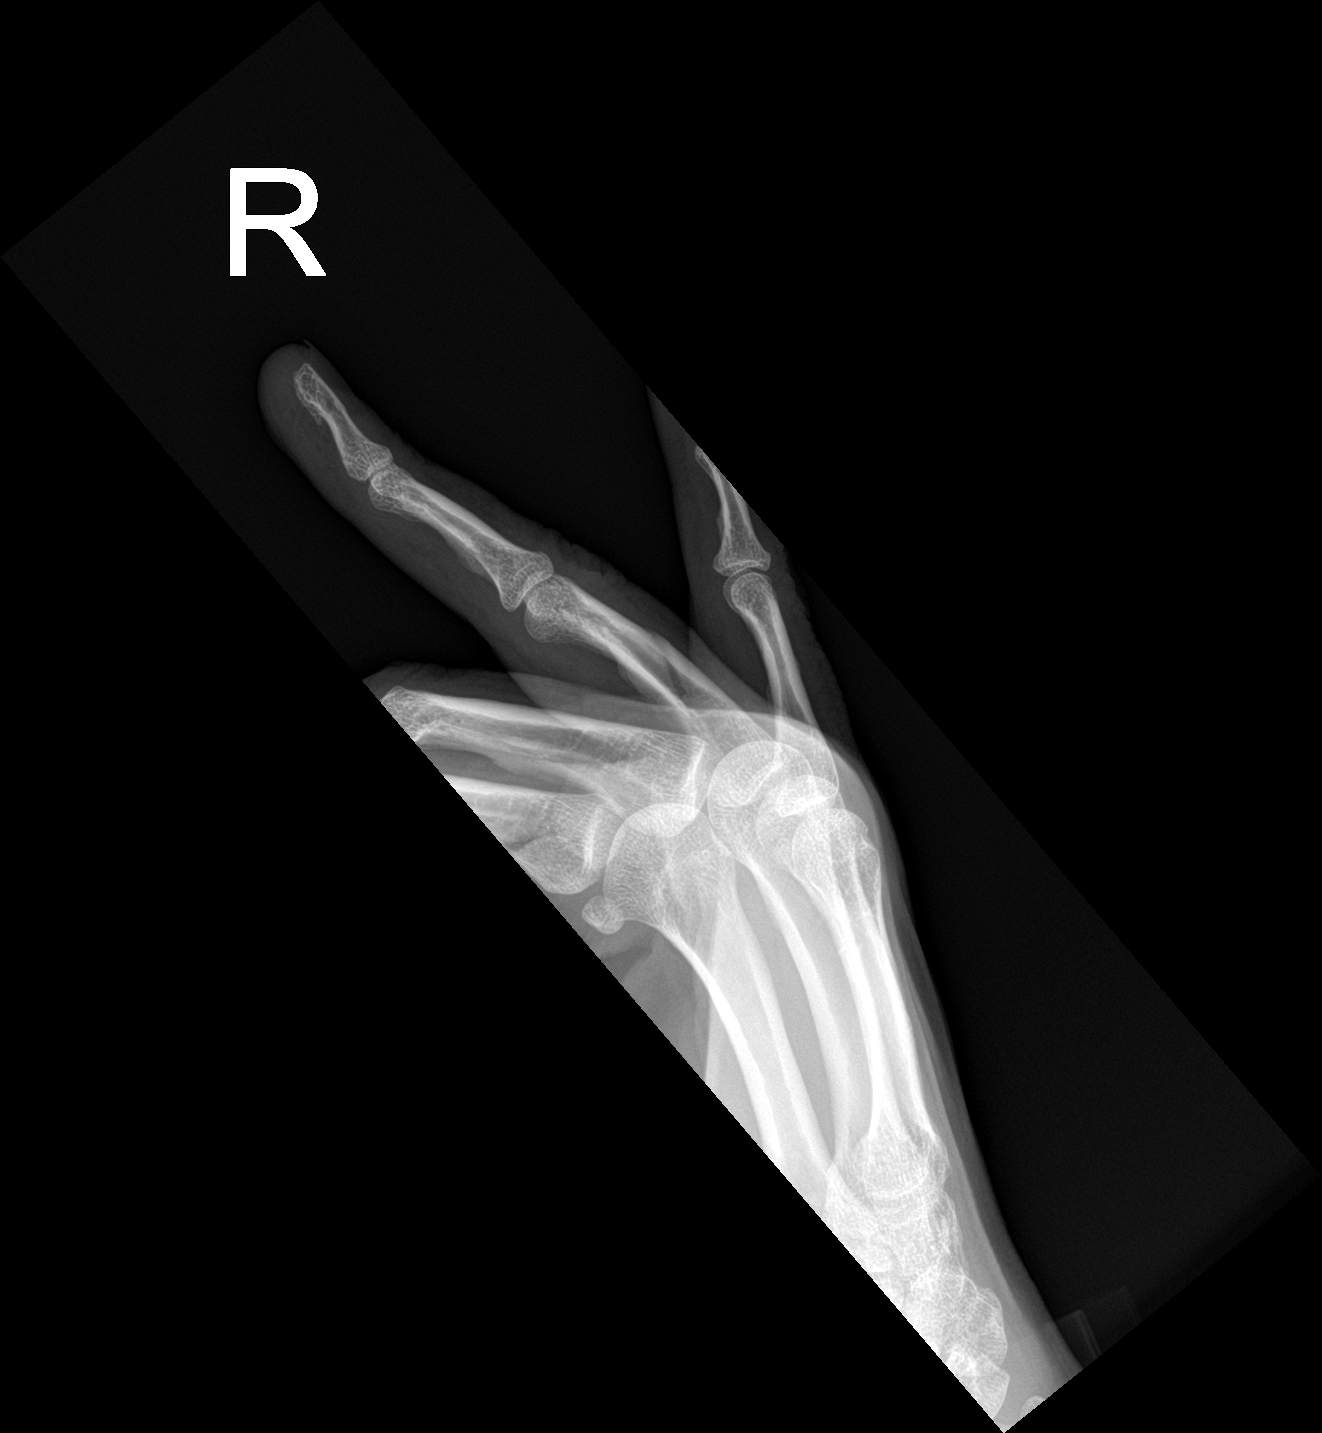

[3 of 3 positions shown; findings below may reference images not displayed]

FINDINGS: Oblique fracture through the fourth proximal phalanx is noted
extending from the midportion distally. Only minimal displacement is
noted. Mild soft tissue swelling is noted.
IMPRESSION: Fourth proximal phalangeal fracture.

## 2022-12-02 DIAGNOSIS — L700 Acne vulgaris: Secondary | ICD-10-CM | POA: Diagnosis not present

## 2022-12-02 DIAGNOSIS — Z79899 Other long term (current) drug therapy: Secondary | ICD-10-CM | POA: Diagnosis not present

## 2023-01-02 DIAGNOSIS — L700 Acne vulgaris: Secondary | ICD-10-CM | POA: Diagnosis not present

## 2023-01-02 DIAGNOSIS — Z79899 Other long term (current) drug therapy: Secondary | ICD-10-CM | POA: Diagnosis not present

## 2023-02-06 DIAGNOSIS — L700 Acne vulgaris: Secondary | ICD-10-CM | POA: Diagnosis not present

## 2023-02-06 DIAGNOSIS — Z79899 Other long term (current) drug therapy: Secondary | ICD-10-CM | POA: Diagnosis not present

## 2023-03-11 DIAGNOSIS — L700 Acne vulgaris: Secondary | ICD-10-CM | POA: Diagnosis not present

## 2023-03-11 DIAGNOSIS — Z79899 Other long term (current) drug therapy: Secondary | ICD-10-CM | POA: Diagnosis not present

## 2023-04-15 DIAGNOSIS — Z79899 Other long term (current) drug therapy: Secondary | ICD-10-CM | POA: Diagnosis not present

## 2023-04-15 DIAGNOSIS — L700 Acne vulgaris: Secondary | ICD-10-CM | POA: Diagnosis not present

## 2023-04-18 DIAGNOSIS — E7800 Pure hypercholesterolemia, unspecified: Secondary | ICD-10-CM | POA: Diagnosis not present

## 2023-04-18 DIAGNOSIS — Z0000 Encounter for general adult medical examination without abnormal findings: Secondary | ICD-10-CM | POA: Diagnosis not present

## 2023-04-18 DIAGNOSIS — Z79899 Other long term (current) drug therapy: Secondary | ICD-10-CM | POA: Diagnosis not present

## 2023-04-18 DIAGNOSIS — E063 Autoimmune thyroiditis: Secondary | ICD-10-CM | POA: Diagnosis not present

## 2023-05-15 DIAGNOSIS — Z79899 Other long term (current) drug therapy: Secondary | ICD-10-CM | POA: Diagnosis not present

## 2023-05-15 DIAGNOSIS — L700 Acne vulgaris: Secondary | ICD-10-CM | POA: Diagnosis not present

## 2023-05-19 DIAGNOSIS — Z01419 Encounter for gynecological examination (general) (routine) without abnormal findings: Secondary | ICD-10-CM | POA: Diagnosis not present

## 2023-05-19 DIAGNOSIS — Z1231 Encounter for screening mammogram for malignant neoplasm of breast: Secondary | ICD-10-CM | POA: Diagnosis not present

## 2023-06-18 DIAGNOSIS — L700 Acne vulgaris: Secondary | ICD-10-CM | POA: Diagnosis not present

## 2023-06-18 DIAGNOSIS — Z79899 Other long term (current) drug therapy: Secondary | ICD-10-CM | POA: Diagnosis not present

## 2023-07-21 DIAGNOSIS — Z79899 Other long term (current) drug therapy: Secondary | ICD-10-CM | POA: Diagnosis not present

## 2023-07-21 DIAGNOSIS — L700 Acne vulgaris: Secondary | ICD-10-CM | POA: Diagnosis not present

## 2023-08-20 DIAGNOSIS — Z79899 Other long term (current) drug therapy: Secondary | ICD-10-CM | POA: Diagnosis not present

## 2023-08-20 DIAGNOSIS — L700 Acne vulgaris: Secondary | ICD-10-CM | POA: Diagnosis not present

## 2023-09-18 DIAGNOSIS — F330 Major depressive disorder, recurrent, mild: Secondary | ICD-10-CM | POA: Diagnosis not present

## 2023-09-22 DIAGNOSIS — K130 Diseases of lips: Secondary | ICD-10-CM | POA: Diagnosis not present

## 2023-09-22 DIAGNOSIS — Z79899 Other long term (current) drug therapy: Secondary | ICD-10-CM | POA: Diagnosis not present

## 2023-09-22 DIAGNOSIS — L700 Acne vulgaris: Secondary | ICD-10-CM | POA: Diagnosis not present

## 2023-10-02 DIAGNOSIS — F330 Major depressive disorder, recurrent, mild: Secondary | ICD-10-CM | POA: Diagnosis not present

## 2023-10-15 DIAGNOSIS — F330 Major depressive disorder, recurrent, mild: Secondary | ICD-10-CM | POA: Diagnosis not present

## 2023-10-23 DIAGNOSIS — L700 Acne vulgaris: Secondary | ICD-10-CM | POA: Diagnosis not present

## 2023-10-23 DIAGNOSIS — Z79899 Other long term (current) drug therapy: Secondary | ICD-10-CM | POA: Diagnosis not present
